# Patient Record
Sex: Male | Born: 1940 | Race: Black or African American | Hispanic: No | Marital: Single | Smoking: Former smoker
Health system: Southern US, Community
[De-identification: ages and names within clinical notes are randomized; demographics above are authoritative.]

## PROBLEM LIST (undated history)

## (undated) DIAGNOSIS — E785 Hyperlipidemia, unspecified: Secondary | ICD-10-CM

## (undated) DIAGNOSIS — E119 Type 2 diabetes mellitus without complications: Secondary | ICD-10-CM

---

## 2016-05-11 ENCOUNTER — Emergency Department (HOSPITAL_COMMUNITY): Payer: PRIVATE HEALTH INSURANCE

## 2016-05-11 ENCOUNTER — Encounter (HOSPITAL_COMMUNITY): Payer: Self-pay | Admitting: Emergency Medicine

## 2016-05-11 ENCOUNTER — Observation Stay (HOSPITAL_COMMUNITY)
Admission: EM | Admit: 2016-05-11 | Discharge: 2016-05-14 | Disposition: A | Discharge status: Home / Self Care | Payer: PRIVATE HEALTH INSURANCE | Attending: Cardiovascular Disease | Admitting: Cardiovascular Disease

## 2016-05-11 DIAGNOSIS — Z87891 Personal history of nicotine dependence: Secondary | ICD-10-CM | POA: Insufficient documentation

## 2016-05-11 DIAGNOSIS — R9439 Abnormal result of other cardiovascular function study: Secondary | ICD-10-CM | POA: Diagnosis not present

## 2016-05-11 DIAGNOSIS — R9430 Abnormal result of cardiovascular function study, unspecified: Secondary | ICD-10-CM | POA: Diagnosis present

## 2016-05-11 DIAGNOSIS — N179 Acute kidney failure, unspecified: Secondary | ICD-10-CM | POA: Insufficient documentation

## 2016-05-11 DIAGNOSIS — I472 Ventricular tachycardia: Secondary | ICD-10-CM | POA: Diagnosis not present

## 2016-05-11 DIAGNOSIS — N183 Chronic kidney disease, stage 3 unspecified: Secondary | ICD-10-CM | POA: Diagnosis present

## 2016-05-11 DIAGNOSIS — I2510 Atherosclerotic heart disease of native coronary artery without angina pectoris: Secondary | ICD-10-CM | POA: Diagnosis not present

## 2016-05-11 DIAGNOSIS — I25810 Atherosclerosis of coronary artery bypass graft(s) without angina pectoris: Secondary | ICD-10-CM

## 2016-05-11 DIAGNOSIS — Z7984 Long term (current) use of oral hypoglycemic drugs: Secondary | ICD-10-CM | POA: Insufficient documentation

## 2016-05-11 DIAGNOSIS — E1122 Type 2 diabetes mellitus with diabetic chronic kidney disease: Secondary | ICD-10-CM | POA: Diagnosis not present

## 2016-05-11 DIAGNOSIS — Z8249 Family history of ischemic heart disease and other diseases of the circulatory system: Secondary | ICD-10-CM | POA: Insufficient documentation

## 2016-05-11 DIAGNOSIS — E785 Hyperlipidemia, unspecified: Secondary | ICD-10-CM | POA: Diagnosis not present

## 2016-05-11 DIAGNOSIS — R55 Syncope and collapse: Principal | ICD-10-CM | POA: Diagnosis present

## 2016-05-11 DIAGNOSIS — Z79899 Other long term (current) drug therapy: Secondary | ICD-10-CM | POA: Insufficient documentation

## 2016-05-11 DIAGNOSIS — Z7982 Long term (current) use of aspirin: Secondary | ICD-10-CM | POA: Diagnosis not present

## 2016-05-11 DIAGNOSIS — E1129 Type 2 diabetes mellitus with other diabetic kidney complication: Secondary | ICD-10-CM | POA: Diagnosis present

## 2016-05-11 DIAGNOSIS — I10 Essential (primary) hypertension: Secondary | ICD-10-CM | POA: Diagnosis present

## 2016-05-11 DIAGNOSIS — I129 Hypertensive chronic kidney disease with stage 1 through stage 4 chronic kidney disease, or unspecified chronic kidney disease: Secondary | ICD-10-CM | POA: Diagnosis not present

## 2016-05-11 HISTORY — DX: Hyperlipidemia, unspecified: E78.5

## 2016-05-11 HISTORY — DX: Type 2 diabetes mellitus without complications: E11.9

## 2016-05-11 LAB — CBC WITH DIFFERENTIAL/PLATELET
BASOS ABS: 0 10*3/uL (ref 0.0–0.1)
BASOS PCT: 0 %
Eosinophils Absolute: 0.1 10*3/uL (ref 0.0–0.7)
Eosinophils Relative: 2 %
HEMATOCRIT: 37.4 % — AB (ref 39.0–52.0)
HEMOGLOBIN: 12.6 g/dL — AB (ref 13.0–17.0)
LYMPHS PCT: 29 %
Lymphs Abs: 1.6 10*3/uL (ref 0.7–4.0)
MCH: 29.7 pg (ref 26.0–34.0)
MCHC: 33.7 g/dL (ref 30.0–36.0)
MCV: 88.2 fL (ref 78.0–100.0)
Monocytes Absolute: 0.4 10*3/uL (ref 0.1–1.0)
Monocytes Relative: 7 %
NEUTROS ABS: 3.5 10*3/uL (ref 1.7–7.7)
NEUTROS PCT: 62 %
Platelets: 223 10*3/uL (ref 150–400)
RBC: 4.24 MIL/uL (ref 4.22–5.81)
RDW: 13 % (ref 11.5–15.5)
WBC: 5.6 10*3/uL (ref 4.0–10.5)

## 2016-05-11 LAB — BASIC METABOLIC PANEL
ANION GAP: 5 (ref 5–15)
BUN: 22 mg/dL — ABNORMAL HIGH (ref 6–20)
CALCIUM: 9.3 mg/dL (ref 8.9–10.3)
CHLORIDE: 106 mmol/L (ref 101–111)
CO2: 29 mmol/L (ref 22–32)
Creatinine, Ser: 1.56 mg/dL — ABNORMAL HIGH (ref 0.61–1.24)
GFR calc non Af Amer: 41 mL/min — ABNORMAL LOW (ref >60)
GFR, EST AFRICAN AMERICAN: 48 mL/min — AB (ref >60)
GLUCOSE: 101 mg/dL — AB (ref 65–99)
POTASSIUM: 3.5 mmol/L (ref 3.5–5.1)
Sodium: 140 mmol/L (ref 135–145)

## 2016-05-11 LAB — I-STAT TROPONIN, ED: TROPONIN I, POC: 0.01 ng/mL (ref 0.00–0.08)

## 2016-05-11 LAB — GLUCOSE, CAPILLARY: GLUCOSE-CAPILLARY: 114 mg/dL — AB (ref 65–99)

## 2016-05-11 LAB — CBG MONITORING, ED: Glucose-Capillary: 114 mg/dL — ABNORMAL HIGH (ref 65–99)

## 2016-05-11 LAB — TROPONIN I: Troponin I: <0.03 ng/mL (ref <0.03)

## 2016-05-11 IMAGING — CR DG CHEST 1V PORT
1 series · 1 of 1 positions shown · non-contrast
Comparison: None.

CLINICAL DATA: Palpitations, shortness of breath, near syncope

EXAM:
PORTABLE CHEST 1 VIEW

[AP]
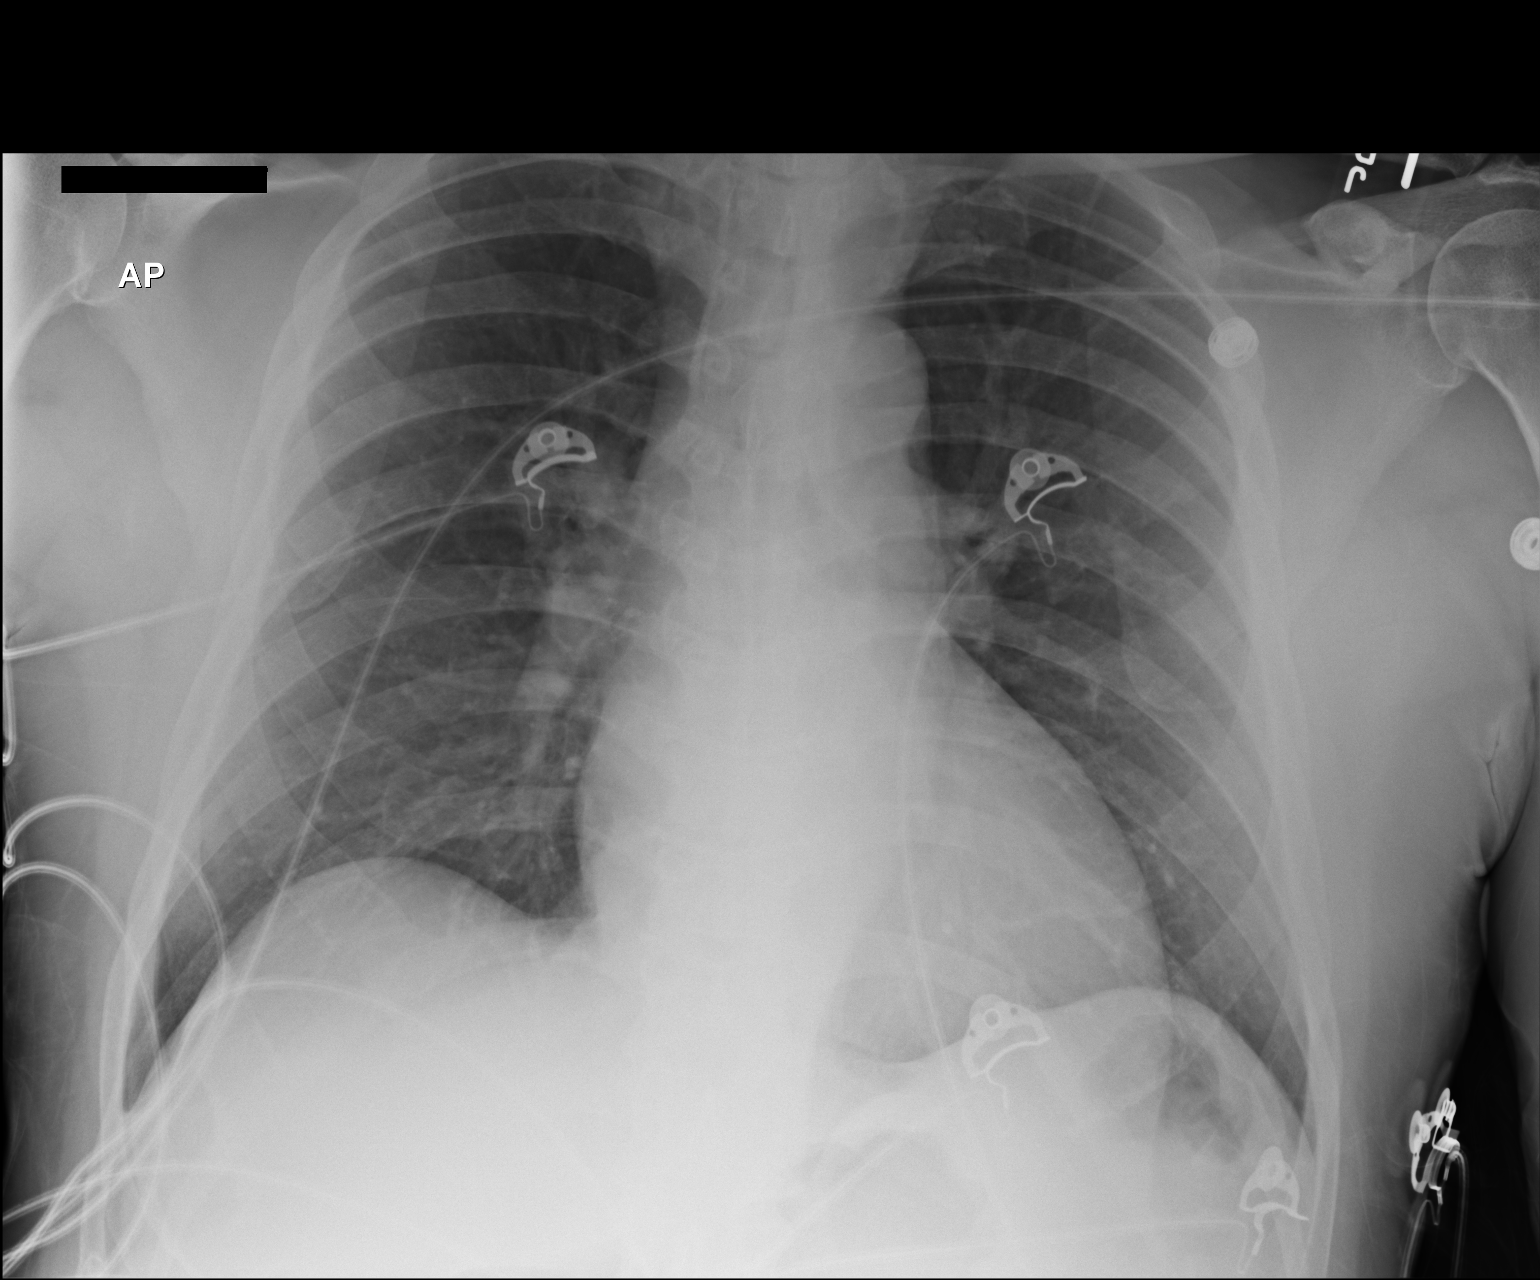

[1 of 1 positions shown; findings below may reference images not displayed]

FINDINGS: Lungs are clear.  No pleural effusion or pneumothorax.

The heart is normal in size.
IMPRESSION: No evidence of acute cardiopulmonary disease.

## 2016-05-11 MED ORDER — ROSUVASTATIN CALCIUM 10 MG PO TABS
10.0000 mg | ORAL_TABLET | Freq: Every evening | ORAL | Status: DC
  Administered 2016-05-11 – 2016-05-13 (×3): 10 mg via ORAL
  Filled 2016-05-11 (×3): qty 1

## 2016-05-11 MED ORDER — ASPIRIN EC 81 MG PO TBEC
81.0000 mg | DELAYED_RELEASE_TABLET | Freq: Every day | ORAL | Status: DC
  Administered 2016-05-12 – 2016-05-13 (×2): 81 mg via ORAL
  Filled 2016-05-11 (×2): qty 1

## 2016-05-11 MED ORDER — ACETAMINOPHEN 325 MG PO TABS: 650.0000 mg | ORAL_TABLET | ORAL | Status: DC | PRN

## 2016-05-11 MED ORDER — HEPARIN SODIUM (PORCINE) 5000 UNIT/ML IJ SOLN
5000.0000 [IU] | Freq: Three times a day (TID) | INTRAMUSCULAR | Status: DC
  Administered 2016-05-11 – 2016-05-14 (×7): 5000 [IU] via SUBCUTANEOUS
  Filled 2016-05-11 (×7): qty 1

## 2016-05-11 MED ORDER — INSULIN ASPART 100 UNIT/ML ~~LOC~~ SOLN: 0.0000 [IU] | Freq: Three times a day (TID) | SUBCUTANEOUS | Status: DC

## 2016-05-11 MED ORDER — NITROGLYCERIN 0.4 MG SL SUBL: 0.4000 mg | SUBLINGUAL_TABLET | SUBLINGUAL | Status: DC | PRN

## 2016-05-11 MED ORDER — ASPIRIN 81 MG PO CHEW
324.0000 mg | CHEWABLE_TABLET | ORAL | Status: AC
  Administered 2016-05-11: 324 mg via ORAL
  Filled 2016-05-11: qty 4

## 2016-05-11 MED ORDER — ASPIRIN 300 MG RE SUPP: 300.0000 mg | RECTAL | Status: AC

## 2016-05-11 MED ORDER — IRBESARTAN 75 MG PO TABS
37.5000 mg | ORAL_TABLET | Freq: Every day | ORAL | Status: DC
  Administered 2016-05-12: 37.5 mg via ORAL
  Filled 2016-05-11: qty 0.5

## 2016-05-11 MED ORDER — ONDANSETRON HCL 4 MG/2ML IJ SOLN: 4.0000 mg | Freq: Four times a day (QID) | INTRAMUSCULAR | Status: DC | PRN

## 2016-05-11 NOTE — H&P (Signed)
History & Physical    Patient ID: Delphin Funes MRN: [id number], DOB/AGE: Apr 24, 1941   Admit date: 05/11/2016   Primary Physician: No primary care provider on file. Primary Cardiologist: New  Patient Profile    75 yo male with PMH of HTN, HLD Vertigo and DM who presented to the [facility] ED with c/o near syncope and palpitations.  Past Medical History    Past Medical History:  Diagnosis Date  . Diabetes mellitus without complication (HCC)   . Hyperlipemia     History reviewed. No pertinent surgical history.   Allergies  No Known Allergies  History of Present Illness    Mr. Gilman is a 75 year old male with past medical history of hypertension, hyperlipidemia, vertigo and diabetes mellitus type 2. Reports he currently lives in [place], but has been here visiting his daughter for the past couple of weeks. He denies ever having had a cardiac workup in the past, or seeing a cardiologist. Does report family history of cardiac disease with mother having an MI in her 96s. States his father past when he was young. He is very active 75 year old who currently cares for his wife back at home. States he does not normally have any anginal symptoms with physical activity, or dyspnea on exertion. Reports he has had intermittent episodes over the past couple of weeks where he is felt lightheaded for a brief period, but normally resolves itself. Reports this morning at 9 AM he began to have palpitations, felt like his heart was racing, and his head felt "funny". Reports he has been drinking Herbalife shakes and teas over the past couple of weeks. He is unsure whether there is caffeine and these products. Also does report being under increased amount of stress with having his granddaughter visited him in [place], and caring for his wife.  He presented to the [facility] with his symptoms and experienced another near syncopal episode while there. At that time EMS was called for transfer to [facility] ED. In  the ED his EKG shows sinus rhythm with PACs, with nonspecific T-wave changes. There are no other EKGs on file to compare too. Labs showed normal electrolytes, but increased creatinine of 1.56, hemoglobin 12.6, and 1 negative POC troponin. Chest x-ray showed no edema or acute disease. While in the ED he has had 2 brief runs of NSVT, couplets, and frequent PVCs noted on telemetry. He is currently asymptomatic at time of exam.  Home Medications    Prior to Admission medications   Not on File    Family History    History reviewed. No pertinent family history.  Social History    Social History   Social History  . Marital status: Single    Spouse name: N/A  . Number of children: N/A  . Years of education: N/A   Occupational History  . Not on file.   Social History Main Topics  . Smoking status: Former Games developer  . Smokeless tobacco: Not on file  . Alcohol use No  . Drug use: No  . Sexual activity: Not on file   Other Topics Concern  . Not on file   Social History Narrative  . No narrative on file     Review of Systems    General:  No chills, fever, night sweats or weight changes.  Cardiovascular: See HPI Dermatological: No rash, lesions/masses Respiratory: No cough, dyspnea Urologic: No hematuria, dysuria Abdominal:   No nausea, vomiting, diarrhea, bright red blood per rectum, melena, or hematemesis Neurologic:  No visual changes, wkns, changes in mental status. All other systems reviewed and are otherwise negative except as noted above.  Physical Exam    Blood pressure 135/79, pulse (!) 59, temperature 97.9 F (36.6 C), temperature source Oral, resp. rate 17, SpO2 94 %.  General: Pleasant Older African-American male, NAD Psych: Normal affect. Neuro: Alert and oriented X 3. Moves all extremities spontaneously. HEENT: Normal  Neck: Supple without bruits or JVD. Lungs:  Resp regular and unlabored, CTA. Heart: RRR no s3, s4, or murmurs. Abdomen: Soft, non-tender,  non-distended, BS + x 4.  Extremities: No clubbing, cyanosis, chronic 1+ lower extremity edema. DP/PT/Radials 2+ and equal bilaterally.  Labs    Troponin [facility](Point of Care Test)  Recent Labs  05/11/16 1349  TROPIPOC 0.01   No results for input(s): CKTOTAL, CKMB, TROPONINI in the last 72 hours. Lab Results  Component Value Date   WBC 5.6 05/11/2016   HGB 12.6 (L) 05/11/2016   HCT 37.4 (L) 05/11/2016   MCV 88.2 05/11/2016   PLT 223 05/11/2016     Recent Labs Lab 05/11/16 1346  NA 140  K 3.5  CL 106  CO2 29  BUN 22*  CREATININE 1.56*  CALCIUM 9.3  GLUCOSE 101*   No results found for: CHOL, HDL, LDLCALC, TRIG No results found for: [facility]   Radiology Studies    Dg Chest Port 1 View  Result Date: 05/11/2016 CLINICAL DATA:  Palpitations, shortness of breath, near syncope EXAM: PORTABLE CHEST 1 VIEW COMPARISON:  None. FINDINGS: Lungs are clear.  No pleural effusion or pneumothorax. The heart is normal in size. IMPRESSION: No evidence of acute cardiopulmonary disease. Electronically Signed   By: Charline BillsSriyesh  Krishnan M.D.   On: 05/11/2016 13:23    ECG & Cardiac Imaging    EKG: sinus rhythm with PACs, with nonspecific T-wave changes.  Assessment & Plan    75 yo male with PMH of HTN, HLD Vertigo and DM who [facility] with c/o near syncope and palpitations.  1. Presyncope: Reports he has recently been drinking herbalife shakes. States this morning he drank 2 packets of herbalife teas prior to experiencing symptoms of presyncope and palpitations. Labs in the ED showed neg POC trop, Cr 1.56, stable Hgb. EKG shows SR with PACs with nonspecific T wave changes, with telemetry noting 2 short runs of NSVT, and freq PVCs. Chest x-ray was negative. He is currently asymptomatic. Denies any chest pain, but does have concerning risk factors including HTN, HLD and DM II.  -- Admit to telemetry to observe for further arrhthymias.  -- Check 2D echo -- Cycle enzymes  -- Make  NPO tomorrow and plan for exercise myoview   2. HTN: Continue ARB  3. HLD: Check Lipid panel. Continue statin  4. DM II: Hold januvia, start SSI  5. AKI: Cr slightly elevated. Will hold Lasix as he does not appear to be volume overloaded at this time. Follow BMET   Signed, Laverda PageLindsay Roberts, NP-C Pager [phone] 05/11/2016, 4:27 PM  Patient examined chart reviewed.  Concerning that sense he was going to pass out correlated With palpitations and he is noted to have PAC;s and NSVT on monitor. QT ok no chest pain Or history of CAD. No murmur on exam Chronic LE edema he takes diuretic for which explains Mild azotemia. Supplement K  Admit for telemetry. R/O structural heart dx with echo and  Exercise induced arrhythmia / CAD with myovue in am. Possible d/c late Saturday if tests  Ok and monitor with no malignant arrhythmias  Charlton Haws

## 2016-05-11 NOTE — ED Notes (Signed)
This RN notified at this time of 4 beat run of v-tach on monitor  Dr. Ranae PalmsYelverton aware . Patient on defib pads and cardiac monitor. Resolved pt feels fine resting in bed  NNO.

## 2016-05-11 NOTE — ED Notes (Signed)
Attempted report, bed recently assigned, RN unaware

## 2016-05-11 NOTE — ED Provider Notes (Signed)
[facility]-EMERGENCY DEPT Provider Note   CSN: [id number] Arrival date & time: 05/11/16  1257     History   Chief Complaint Chief Complaint  Patient presents with  . Near Syncope    HPI Daniel Ramsey is a 75 y.o. male.  Patient presents to the emergency department with chief complaint of near syncope. He states that he has had several episodes today of feeling faint like he is going to pass out. He reports the first started at about 9:00 this morning. He went to a clinic, and was referred to the emergency department for further evaluation. He denies any chest pain, shortness of breath, nausea, diaphoresis, or any other symptoms. He states that he simply feels like he is going to pass out. He has not actually passed out. He denies ever having similar symptoms to this in the past. There are no other associated symptoms. There are no modifying factors.   The history is provided by the patient. No language interpreter was used.    Past Medical History:  Diagnosis Date  . Diabetes mellitus without complication (HCC)   . Hyperlipemia     There are no active problems to display for this patient.   History reviewed. No pertinent surgical history.     Home Medications    Prior to Admission medications   Not on File    Family History History reviewed. No pertinent family history.  Social History Social History  Substance Use Topics  . Smoking status: Former Games developermoker  . Smokeless tobacco: Not on file  . Alcohol use No     Allergies   Review of patient's allergies indicates no known allergies.   Review of Systems Review of Systems  Neurological: Positive for syncope.  All other systems reviewed and are negative.    Physical Exam Updated Vital Signs BP 149/74 (BP Location: Right Arm)   Pulse 76   Temp 97.9 F (36.6 C) (Oral)   Resp 15   SpO2 96%   Physical Exam  Constitutional: He is oriented to person, place, and time. He appears well-developed and  well-nourished.  HENT:  Head: Normocephalic and atraumatic.  Eyes: Conjunctivae and EOM are normal. Pupils are equal, round, and reactive to light. Right eye exhibits no discharge. Left eye exhibits no discharge. No scleral icterus.  Neck: Normal range of motion. Neck supple. No JVD present.  Cardiovascular: Normal rate, regular rhythm and normal heart sounds.  Exam reveals no gallop and no friction rub.   No murmur heard. Pulmonary/Chest: Effort normal and breath sounds normal. No respiratory distress. He has no wheezes. He has no rales. He exhibits no tenderness.  Abdominal: Soft. He exhibits no distension and no mass. There is no tenderness. There is no rebound and no guarding.  Musculoskeletal: Normal range of motion. He exhibits no edema or tenderness.  Neurological: He is alert and oriented to person, place, and time.  Skin: Skin is warm and dry.  Psychiatric: He has a normal mood and affect. His behavior is normal. Judgment and thought content normal.  Nursing note and vitals reviewed.    ED Treatments / Results  Labs (all labs ordered are listed, but only abnormal results are displayed) Labs Reviewed  CBG MONITORING, ED - Abnormal; Notable for the following:       Result Value   Glucose-Capillary 114 (*)    All other components within normal limits  CBC WITH DIFFERENTIAL/PLATELET  BASIC METABOLIC PANEL  I-STAT TROPOININ, ED    EKG  EKG Interpretation  None       Radiology Dg Chest Port 1 View  Result Date: 05/11/2016 CLINICAL DATA:  Palpitations, shortness of breath, near syncope EXAM: PORTABLE CHEST 1 VIEW COMPARISON:  None. FINDINGS: Lungs are clear.  No pleural effusion or pneumothorax. The heart is normal in size. IMPRESSION: No evidence of acute cardiopulmonary disease. Electronically Signed   By: Charline Bills M.D.   On: 05/11/2016 13:23    Procedures Procedures (including critical care time)  Medications Ordered in ED Medications - No data to  display   Initial Impression / Assessment and Plan / ED Course  I have reviewed the triage vital signs and the nursing notes.  Pertinent labs & imaging results that were available during my care of the patient were reviewed by me and considered in my medical decision making (see chart for details).  Clinical Course   1:30 PM Patient with near syncope.  During exam I witnessed a short run of v-tach on the monitor.  Patient seen by and discussed with Dr. Ranae Palms, recommends cardiology consult.  1:54 PM Discussed with Dr. Tenny Craw from cardiology, who will consult. No amiodarone for now.  VSS.  Dr. Ranae Palms updated.  Pads placed on patient.  Patient discussed with Dr. Vinnie Level and Ardelle Park, who are aware of the patient.  Dispo per cards.  Final Clinical Impressions(s) / ED Diagnoses   Final diagnoses:  None    New Prescriptions New Prescriptions   No medications on file     Roxy Horseman, PA-C 05/11/16 1535    Loren Racer, MD 05/16/16 [phone]

## 2016-05-11 NOTE — ED Triage Notes (Signed)
Per EMS patient drank a new herbalife shake, felt heart palpitating, like he was going to faint. Went to [facility] while there had another near syncopal event. EKG showed occasional PAC and PVCs.  Urgent care called EMS. Their vitals HR 50 bp 116/71 . Patient denies recent n/v/diarrhea. States symptoms have improved at present. CBG 135.18g LFA. Patient from [place], just moved here Aug 23rd. EMS vitals 150/88 HR 70 RR 18, 98% RA

## 2016-05-12 ENCOUNTER — Observation Stay (HOSPITAL_COMMUNITY): Payer: PRIVATE HEALTH INSURANCE

## 2016-05-12 ENCOUNTER — Observation Stay (HOSPITAL_BASED_OUTPATIENT_CLINIC_OR_DEPARTMENT_OTHER): Payer: PRIVATE HEALTH INSURANCE

## 2016-05-12 ENCOUNTER — Encounter (HOSPITAL_COMMUNITY): Payer: Self-pay | Admitting: Nurse Practitioner

## 2016-05-12 DIAGNOSIS — R9431 Abnormal electrocardiogram [ECG] [EKG]: Secondary | ICD-10-CM

## 2016-05-12 DIAGNOSIS — I10 Essential (primary) hypertension: Secondary | ICD-10-CM | POA: Diagnosis present

## 2016-05-12 DIAGNOSIS — R55 Syncope and collapse: Secondary | ICD-10-CM

## 2016-05-12 DIAGNOSIS — I472 Ventricular tachycardia: Secondary | ICD-10-CM

## 2016-05-12 DIAGNOSIS — E1129 Type 2 diabetes mellitus with other diabetic kidney complication: Secondary | ICD-10-CM | POA: Diagnosis present

## 2016-05-12 DIAGNOSIS — N183 Chronic kidney disease, stage 3 unspecified: Secondary | ICD-10-CM | POA: Diagnosis present

## 2016-05-12 DIAGNOSIS — E785 Hyperlipidemia, unspecified: Secondary | ICD-10-CM | POA: Diagnosis present

## 2016-05-12 DIAGNOSIS — R9430 Abnormal result of cardiovascular function study, unspecified: Secondary | ICD-10-CM | POA: Diagnosis present

## 2016-05-12 LAB — BASIC METABOLIC PANEL
Anion gap: 5 (ref 5–15)
BUN: 20 mg/dL (ref 6–20)
CHLORIDE: 108 mmol/L (ref 101–111)
CO2: 29 mmol/L (ref 22–32)
CREATININE: 1.53 mg/dL — AB (ref 0.61–1.24)
Calcium: 9 mg/dL (ref 8.9–10.3)
GFR calc non Af Amer: 42 mL/min — ABNORMAL LOW (ref >60)
GFR, EST AFRICAN AMERICAN: 49 mL/min — AB (ref >60)
Glucose, Bld: 84 mg/dL (ref 65–99)
POTASSIUM: 3.9 mmol/L (ref 3.5–5.1)
SODIUM: 142 mmol/L (ref 135–145)

## 2016-05-12 LAB — GLUCOSE, CAPILLARY
GLUCOSE-CAPILLARY: 85 mg/dL (ref 65–99)
GLUCOSE-CAPILLARY: 159 mg/dL — AB (ref 65–99)
GLUCOSE-CAPILLARY: 118 mg/dL — AB (ref 65–99)
GLUCOSE-CAPILLARY: 104 mg/dL — AB (ref 65–99)
Glucose-Capillary: 76 mg/dL (ref 65–99)

## 2016-05-12 LAB — LIPID PANEL
CHOL/HDL RATIO: 3.6 ratio
CHOLESTEROL: 142 mg/dL (ref 0–200)
HDL: 39 mg/dL — AB (ref >40)
LDL Cholesterol: 88 mg/dL (ref 0–99)
TRIGLYCERIDES: 74 mg/dL (ref <150)
VLDL: 15 mg/dL (ref 0–40)

## 2016-05-12 LAB — NM MYOCAR MULTI W/SPECT W/WALL MOTION / EF
CHL CUP RESTING HR STRESS: 68 {beats}/min
CSEPEDS: 33 s
CSEPPHR: 157 {beats}/min
Exercise duration (min): 2 min

## 2016-05-12 LAB — CBC
HEMATOCRIT: 36.1 % — AB (ref 39.0–52.0)
Hemoglobin: 11.5 g/dL — ABNORMAL LOW (ref 13.0–17.0)
MCH: 28.6 pg (ref 26.0–34.0)
MCHC: 31.9 g/dL (ref 30.0–36.0)
MCV: 89.8 fL (ref 78.0–100.0)
PLATELETS: 209 10*3/uL (ref 150–400)
RBC: 4.02 MIL/uL — AB (ref 4.22–5.81)
RDW: 12.9 % (ref 11.5–15.5)
WBC: 4.5 10*3/uL (ref 4.0–10.5)

## 2016-05-12 LAB — TROPONIN I
Troponin I: <0.03 ng/mL (ref <0.03)
Troponin I: <0.03 ng/mL (ref <0.03)

## 2016-05-12 MED ORDER — TECHNETIUM TC 99M TETROFOSMIN IV KIT
30.0000 | PACK | Freq: Once | INTRAVENOUS | Status: AC | PRN
  Administered 2016-05-12: 30 via INTRAVENOUS

## 2016-05-12 MED ORDER — REGADENOSON 0.4 MG/5ML IV SOLN
INTRAVENOUS | Status: AC
  Filled 2016-05-12: qty 5

## 2016-05-12 MED ORDER — TECHNETIUM TC 99M TETROFOSMIN IV KIT
10.0000 | PACK | Freq: Once | INTRAVENOUS | Status: AC | PRN
  Administered 2016-05-12: 10 via INTRAVENOUS

## 2016-05-12 NOTE — Progress Notes (Signed)
Gxt completed. Images and final report pending  Corine ShelterLUKE Tracy Kinner PA-C 05/12/2016 11:06 AM

## 2016-05-12 NOTE — Progress Notes (Signed)
  Echocardiogram 2D Echocardiogram has been performed.  Arvil ChacoFoster, Alicia Ackert 05/12/2016, 6:27 PM

## 2016-05-12 NOTE — Progress Notes (Deleted)
Discussed Myoview results with pt and family. He wants to think about cath. Will discuss further in am.  Corine ShelterLUKE Adonus Uselman PA-C 05/12/2016 4:50 PM

## 2016-05-12 NOTE — Progress Notes (Signed)
Patient ID: Daniel Ramsey, male   DOB: [birth year], 75 y.o.   MRN: [id number]    Patient Name: Daniel Maskerlfred Ramsey Date of Encounter: 05/12/2016     Active Problems:   Pre-syncope    SUBJECTIVE  No syncope or palpitations. No chest pain.  CURRENT MEDS . aspirin EC  81 mg Oral Daily  . heparin  5,000 Units Subcutaneous Q8H  . insulin aspart  0-15 Units Subcutaneous TID WC  . irbesartan  37.5 mg Oral Daily  . rosuvastatin  10 mg Oral QPM    OBJECTIVE  Vitals:   05/12/16 1045 05/12/16 1103 05/12/16 1107 05/12/16 1108  BP: (!) 156/99 (!) 143/74 (!) 198/73 (!) 151/73  Pulse:      Resp:      Temp:      TempSrc:      SpO2:      Weight:      Height:        Intake/Output Summary (Last 24 hours) at 05/12/16 1341 Last data filed at 05/11/16 1649  Gross per 24 hour  Intake                0 ml  Output              150 ml  Net             -150 ml   Filed Weights   05/12/16 0600  Weight: 179 lb 3.7 oz (81.3 kg)    PHYSICAL EXAM  General: Pleasant, NAD. Neuro: Alert and oriented X 3. Moves all extremities spontaneously. Psych: Normal affect. HEENT:  Normal  Neck: Supple without bruits or JVD. Lungs:  Resp regular and unlabored, CTA. Heart: RRR no s3, s4, or murmurs. Abdomen: Soft, non-tender, non-distended, BS + x 4.  Extremities: No clubbing, cyanosis or edema. DP/PT/Radials 2+ and equal bilaterally.  Accessory Clinical Findings  CBC  Recent Labs  05/11/16 1346 05/12/16 0718  WBC 5.6 4.5  NEUTROABS 3.5  --   HGB 12.6* 11.5*  HCT 37.4* 36.1*  MCV 88.2 89.8  PLT 223 209   Basic Metabolic Panel  Recent Labs  05/11/16 1346 05/12/16 0718  NA 140 142  K 3.5 3.9  CL 106 108  CO2 29 29  GLUCOSE 101* 84  BUN 22* 20  CREATININE 1.56* 1.53*  CALCIUM 9.3 9.0   Liver Function Tests No results for input(s): AST, ALT, ALKPHOS, BILITOT, PROT, ALBUMIN in the last 72 hours. No results for input(s): LIPASE, AMYLASE in the last 72 hours. Cardiac Enzymes  Recent  Labs  05/11/16 1840 05/11/16 2329 05/12/16 0718  TROPONINI <0.03 <0.03 <0.03   BNP Invalid input(s): POCBNP D-Dimer No results for input(s): DDIMER in the last 72 hours. Hemoglobin A1C No results for input(s): HGBA1C in the last 72 hours. Fasting Lipid Panel  Recent Labs  05/12/16 0718  CHOL 142  HDL 39*  LDLCALC 88  TRIG 74  CHOLHDL 3.6   Thyroid Function Tests No results for input(s): TSH, T4TOTAL, T3FREE, THYROIDAB in the last 72 hours.  Invalid input(s): FREET3  TELE NSR with PVC's  Radiology/Studies  Dg Chest Port 1 View  Result Date: 05/11/2016 CLINICAL DATA:  Palpitations, shortness of breath, near syncope EXAM: PORTABLE CHEST 1 VIEW COMPARISON:  None. FINDINGS: Lungs are clear.  No pleural effusion or pneumothorax. The heart is normal in size. IMPRESSION: No evidence of acute cardiopulmonary disease. Electronically Signed   By: Charline BillsSriyesh  Krishnan M.D.   On: 05/11/2016 13:23    ASSESSMENT  AND PLAN  1. NSVT - no additional symptoms. He has undergone exercise treadmill. Results are pending. He can go home once back if no big scar or ischemia. 2. HTN - his blood pressure is elevated. Go home on coreg 3.125 bid (new script) 3. Dizzy - the etiology is unclear. No additional treatment now. If it continues, would consider a heart monitor.  Gregg Taylor,M.D.  05/12/2016 1:41 PM

## 2016-05-12 NOTE — Progress Notes (Signed)
Myoview abnormal. Pt wants to think about cath. I will place on cath board. I also stopped ARB with renal insufficiency pre cath.   Corine ShelterLUKE Emera Bussie PA-C 05/12/2016 4:53 PM

## 2016-05-13 ENCOUNTER — Encounter (HOSPITAL_COMMUNITY): Payer: Self-pay | Admitting: *Deleted

## 2016-05-13 DIAGNOSIS — R55 Syncope and collapse: Secondary | ICD-10-CM | POA: Diagnosis not present

## 2016-05-13 LAB — ECHOCARDIOGRAM COMPLETE
Height: 66 in
Weight: 2867.74 oz

## 2016-05-13 LAB — BASIC METABOLIC PANEL
Anion gap: 4 — ABNORMAL LOW (ref 5–15)
BUN: 18 mg/dL (ref 6–20)
CALCIUM: 8.9 mg/dL (ref 8.9–10.3)
CO2: 26 mmol/L (ref 22–32)
CREATININE: 1.58 mg/dL — AB (ref 0.61–1.24)
Chloride: 111 mmol/L (ref 101–111)
GFR calc non Af Amer: 41 mL/min — ABNORMAL LOW (ref >60)
GFR, EST AFRICAN AMERICAN: 47 mL/min — AB (ref >60)
Glucose, Bld: 93 mg/dL (ref 65–99)
Potassium: 4 mmol/L (ref 3.5–5.1)
SODIUM: 141 mmol/L (ref 135–145)

## 2016-05-13 LAB — PROTIME-INR
INR: 1.02
Prothrombin Time: 13.4 seconds (ref 11.4–15.2)

## 2016-05-13 LAB — GLUCOSE, CAPILLARY
GLUCOSE-CAPILLARY: 83 mg/dL (ref 65–99)
GLUCOSE-CAPILLARY: 83 mg/dL (ref 65–99)
Glucose-Capillary: 85 mg/dL (ref 65–99)

## 2016-05-13 MED ORDER — SODIUM CHLORIDE 0.9% FLUSH: 3.0000 mL | INTRAVENOUS | Status: DC | PRN

## 2016-05-13 MED ORDER — SODIUM CHLORIDE 0.9 % WEIGHT BASED INFUSION
1.0000 mL/kg/h | INTRAVENOUS | Status: DC
  Administered 2016-05-13: 1 mL/kg/h via INTRAVENOUS

## 2016-05-13 MED ORDER — SODIUM CHLORIDE 0.9 % IV SOLN: 250.0000 mL | INTRAVENOUS | Status: DC | PRN

## 2016-05-13 MED ORDER — ASPIRIN 81 MG PO CHEW
81.0000 mg | CHEWABLE_TABLET | ORAL | Status: AC
  Administered 2016-05-14: 81 mg via ORAL
  Filled 2016-05-13: qty 1

## 2016-05-13 MED ORDER — SODIUM CHLORIDE 0.9% FLUSH
3.0000 mL | Freq: Two times a day (BID) | INTRAVENOUS | Status: DC
  Administered 2016-05-13: 3 mL via INTRAVENOUS

## 2016-05-13 NOTE — Progress Notes (Signed)
Accu ck. 110

## 2016-05-13 NOTE — Progress Notes (Signed)
Patient ID: Daniel Ramsey, male   DOB: [birth year], 75 y.o.   MRN: [id number]    Patient Name: Daniel Ramsey Date of Encounter: 05/13/2016     Principal Problem:   Pre-syncope Active Problems:   Essential hypertension   Controlled type 2 diabetes with renal manifestation (HCC)   Dyslipidemia   Abnormal result of cardiovascular function study   Chronic renal disease, stage III    SUBJECTIVE  No chest pain or sob. Note results of stess test.  CURRENT MEDS . aspirin EC  81 mg Oral Daily  . heparin  5,000 Units Subcutaneous Q8H  . insulin aspart  0-15 Units Subcutaneous TID WC  . rosuvastatin  10 mg Oral QPM    OBJECTIVE  Vitals:   05/12/16 1108 05/12/16 1434 05/12/16 2055 05/13/16 0516  BP: (!) 151/73 128/78 119/72 121/66  Pulse:  83 (!) 58 61  Resp:  18 18 18   Temp:  98.6 F (37 C) 98 F (36.7 C) 98.2 F (36.8 C)  TempSrc:  Oral Oral Oral  SpO2:  100% 99% 100%  Weight:      Height:        Intake/Output Summary (Last 24 hours) at 05/13/16 1046 Last data filed at 05/13/16 0600  Gross per 24 hour  Intake              600 ml  Output                0 ml  Net              600 ml   Filed Weights   05/12/16 0600  Weight: 179 lb 3.7 oz (81.3 kg)    PHYSICAL EXAM  General: Pleasant, NAD. Neuro: Alert and oriented X 3. Moves all extremities spontaneously. Psych: Normal affect. HEENT:  Normal  Neck: Supple without bruits or JVD. Lungs:  Resp regular and unlabored, CTA. Heart: RRR no s3, s4, or murmurs. Abdomen: Soft, non-tender, non-distended, BS + x 4.  Extremities: No clubbing, cyanosis or edema. DP/PT/Radials 2+ and equal bilaterally.  Accessory Clinical Findings  CBC  Recent Labs  05/11/16 1346 05/12/16 0718  WBC 5.6 4.5  NEUTROABS 3.5  --   HGB 12.6* 11.5*  HCT 37.4* 36.1*  MCV 88.2 89.8  PLT 223 209   Basic Metabolic Panel  Recent Labs  05/12/16 0718 05/13/16 0519  NA 142 141  K 3.9 4.0  CL 108 111  CO2 29 26  GLUCOSE 84 93  BUN 20 18   CREATININE 1.53* 1.58*  CALCIUM 9.0 8.9   Liver Function Tests No results for input(s): AST, ALT, ALKPHOS, BILITOT, PROT, ALBUMIN in the last 72 hours. No results for input(s): LIPASE, AMYLASE in the last 72 hours. Cardiac Enzymes  Recent Labs  05/11/16 1840 05/11/16 2329 05/12/16 0718  TROPONINI <0.03 <0.03 <0.03   BNP Invalid input(s): POCBNP D-Dimer No results for input(s): DDIMER in the last 72 hours. Hemoglobin A1C No results for input(s): HGBA1C in the last 72 hours. Fasting Lipid Panel  Recent Labs  05/12/16 0718  CHOL 142  HDL 39*  LDLCALC 88  TRIG 74  CHOLHDL 3.6   Thyroid Function Tests No results for input(s): TSH, T4TOTAL, T3FREE, THYROIDAB in the last 72 hours.  Invalid input(s): FREET3  TELE  NSR  Radiology/Studies  Nm Myocar Multi W/spect W/wall Motion / Ef  Result Date: 05/12/2016 CLINICAL DATA:  75 year old male with a history of syncope/ presyncope. Cardiovascular risk factors include smoking, diabetes, hyperlipidemia. EXAM:  MYOCARDIAL IMAGING WITH SPECT (REST AND PHARMACOLOGIC-STRESS) GATED LEFT VENTRICULAR WALL MOTION STUDY LEFT VENTRICULAR EJECTION FRACTION TECHNIQUE: Standard myocardial SPECT imaging was performed after resting intravenous injection of 10 mCi Tc-22m tetrofosmin. Subsequently, intravenous infusion of Lexiscan was performed under the supervision of the Cardiology staff. At peak effect of the drug, 30 mCi Tc-5m tetrofosmin was injected intravenously and standard myocardial SPECT imaging was performed. Quantitative gated imaging was also performed to evaluate left ventricular wall motion, and estimate left ventricular ejection fraction. COMPARISON:  None. FINDINGS: Perfusion: Moderate sized reversible perfusion defect of mild severity at the inferior lateral wall towards the apex. No fixed defect identified. Wall Motion: Normal left ventricular wall motion. No left ventricular dilation. Left Ventricular Ejection Fraction: 64 % End  diastolic volume 64 ml End systolic volume 23 ml IMPRESSION: 1. There is a moderate sized reversible perfusion defect of mild survey air D at the inferior lateral wall towards the apex. No fixed defect identified. 2. Normal left ventricular wall motion. 3. Left ventricular ejection fraction 64% 4. Non invasive risk stratification*: Intermediate *2012 Appropriate Use Criteria for Coronary Revascularization Focused Update: J Am Coll Cardiol. 2012;59(9):857-881. http://content.dementiazones.com.aspx?articleid=[email or web address] Electronically Signed   By: Gilmer Mor D.O.   On: 05/12/2016 15:06   Dg Chest Port 1 View  Result Date: 05/11/2016 CLINICAL DATA:  Palpitations, shortness of breath, near syncope EXAM: PORTABLE CHEST 1 VIEW COMPARISON:  None. FINDINGS: Lungs are clear.  No pleural effusion or pneumothorax. The heart is normal in size. IMPRESSION: No evidence of acute cardiopulmonary disease. Electronically Signed   By: Charline Bills M.D.   On: 05/11/2016 13:23    ASSESSMENT AND PLAN  1. Near syncope - his NSVT has settled down. His EF is normal. Will follow. 2. Abnormal stress test - Inferior ischemia noted in setting of normal LV function. Will plan on heart cath. Concerned about silent ischemia. 3. NSVT - asymptomatic at present and quiet. Will follow. He will need a beta blocker at discharge.   Devantae Babe,M.D.  05/13/2016 10:46 AM

## 2016-05-14 ENCOUNTER — Encounter (HOSPITAL_COMMUNITY)
Admission: EM | Disposition: A | Discharge status: Home / Self Care | Payer: Self-pay | Source: Home / Self Care | Attending: Emergency Medicine

## 2016-05-14 ENCOUNTER — Other Ambulatory Visit: Payer: Self-pay | Admitting: Cardiology

## 2016-05-14 ENCOUNTER — Encounter (HOSPITAL_COMMUNITY): Payer: Self-pay | Admitting: Cardiovascular Disease

## 2016-05-14 DIAGNOSIS — R55 Syncope and collapse: Secondary | ICD-10-CM | POA: Diagnosis not present

## 2016-05-14 DIAGNOSIS — I25810 Atherosclerosis of coronary artery bypass graft(s) without angina pectoris: Secondary | ICD-10-CM

## 2016-05-14 DIAGNOSIS — I4729 Other ventricular tachycardia: Secondary | ICD-10-CM

## 2016-05-14 DIAGNOSIS — I2510 Atherosclerotic heart disease of native coronary artery without angina pectoris: Secondary | ICD-10-CM | POA: Diagnosis not present

## 2016-05-14 DIAGNOSIS — E785 Hyperlipidemia, unspecified: Secondary | ICD-10-CM

## 2016-05-14 DIAGNOSIS — R9430 Abnormal result of cardiovascular function study, unspecified: Secondary | ICD-10-CM

## 2016-05-14 DIAGNOSIS — I10 Essential (primary) hypertension: Secondary | ICD-10-CM | POA: Diagnosis not present

## 2016-05-14 DIAGNOSIS — I472 Ventricular tachycardia: Secondary | ICD-10-CM

## 2016-05-14 HISTORY — PX: CARDIAC CATHETERIZATION: SHX172

## 2016-05-14 LAB — CBC
HCT: 37 % — ABNORMAL LOW (ref 39.0–52.0)
Hemoglobin: 11.9 g/dL — ABNORMAL LOW (ref 13.0–17.0)
MCH: 29.4 pg (ref 26.0–34.0)
MCHC: 32.2 g/dL (ref 30.0–36.0)
MCV: 91.4 fL (ref 78.0–100.0)
PLATELETS: 254 10*3/uL (ref 150–400)
RBC: 4.05 MIL/uL — AB (ref 4.22–5.81)
RDW: 13.6 % (ref 11.5–15.5)
WBC: 5.1 10*3/uL (ref 4.0–10.5)

## 2016-05-14 LAB — BASIC METABOLIC PANEL
Anion gap: 4 — ABNORMAL LOW (ref 5–15)
BUN: 14 mg/dL (ref 6–20)
CHLORIDE: 112 mmol/L — AB (ref 101–111)
CO2: 27 mmol/L (ref 22–32)
CREATININE: 1.54 mg/dL — AB (ref 0.61–1.24)
Calcium: 8.7 mg/dL — ABNORMAL LOW (ref 8.9–10.3)
GFR calc non Af Amer: 42 mL/min — ABNORMAL LOW (ref >60)
GFR, EST AFRICAN AMERICAN: 48 mL/min — AB (ref >60)
Glucose, Bld: 91 mg/dL (ref 65–99)
Potassium: 4.4 mmol/L (ref 3.5–5.1)
Sodium: 143 mmol/L (ref 135–145)

## 2016-05-14 LAB — GLUCOSE, CAPILLARY
GLUCOSE-CAPILLARY: 81 mg/dL (ref 65–99)
GLUCOSE-CAPILLARY: 77 mg/dL (ref 65–99)
Glucose-Capillary: 110 mg/dL — ABNORMAL HIGH (ref 65–99)
Glucose-Capillary: 85 mg/dL (ref 65–99)

## 2016-05-14 LAB — CREATININE, SERUM
CREATININE: 1.45 mg/dL — AB (ref 0.61–1.24)
GFR, EST AFRICAN AMERICAN: 52 mL/min — AB (ref >60)
GFR, EST NON AFRICAN AMERICAN: 45 mL/min — AB (ref >60)

## 2016-05-14 SURGERY — LEFT HEART CATH AND CORONARY ANGIOGRAPHY
Anesthesia: LOCAL

## 2016-05-14 MED ORDER — DIAZEPAM 5 MG PO TABS: 5.0000 mg | ORAL_TABLET | ORAL | Status: DC | PRN

## 2016-05-14 MED ORDER — FENTANYL CITRATE (PF) 100 MCG/2ML IJ SOLN
INTRAMUSCULAR | Status: DC | PRN
  Administered 2016-05-14: 25 ug via INTRAVENOUS

## 2016-05-14 MED ORDER — ACETAMINOPHEN 325 MG PO TABS: 650.0000 mg | ORAL_TABLET | ORAL | Status: DC | PRN

## 2016-05-14 MED ORDER — SODIUM CHLORIDE 0.9 % IV SOLN: 150.0000 mL/h | INTRAVENOUS | Status: AC

## 2016-05-14 MED ORDER — VERAPAMIL HCL 2.5 MG/ML IV SOLN
INTRAVENOUS | Status: AC
  Filled 2016-05-14: qty 2

## 2016-05-14 MED ORDER — MIDAZOLAM HCL 2 MG/2ML IJ SOLN
INTRAMUSCULAR | Status: AC
  Filled 2016-05-14: qty 2

## 2016-05-14 MED ORDER — LIDOCAINE HCL (PF) 1 % IJ SOLN
INTRAMUSCULAR | Status: DC | PRN
  Administered 2016-05-14: 2 mL via INTRADERMAL

## 2016-05-14 MED ORDER — METOPROLOL TARTRATE 25 MG PO TABS
25.0000 mg | ORAL_TABLET | Freq: Two times a day (BID) | ORAL | Status: DC
  Administered 2016-05-14: 25 mg via ORAL
  Filled 2016-05-14: qty 1

## 2016-05-14 MED ORDER — HEPARIN (PORCINE) IN NACL 2-0.9 UNIT/ML-% IJ SOLN
INTRAMUSCULAR | Status: AC
  Filled 2016-05-14: qty 1500

## 2016-05-14 MED ORDER — ASPIRIN 81 MG PO CHEW: 81.0000 mg | CHEWABLE_TABLET | Freq: Every day | ORAL | Status: DC

## 2016-05-14 MED ORDER — LIDOCAINE HCL (PF) 1 % IJ SOLN
INTRAMUSCULAR | Status: AC
  Filled 2016-05-14: qty 30

## 2016-05-14 MED ORDER — ATORVASTATIN CALCIUM 80 MG PO TABS: 80.0000 mg | ORAL_TABLET | Freq: Every day | ORAL | Status: DC

## 2016-05-14 MED ORDER — FENTANYL CITRATE (PF) 100 MCG/2ML IJ SOLN
INTRAMUSCULAR | Status: AC
  Filled 2016-05-14: qty 2

## 2016-05-14 MED ORDER — SODIUM CHLORIDE 0.9% FLUSH: 3.0000 mL | INTRAVENOUS | Status: DC | PRN

## 2016-05-14 MED ORDER — MIDAZOLAM HCL 2 MG/2ML IJ SOLN
INTRAMUSCULAR | Status: DC | PRN
  Administered 2016-05-14: 1 mg via INTRAVENOUS

## 2016-05-14 MED ORDER — IOPAMIDOL (ISOVUE-370) INJECTION 76%
INTRAVENOUS | Status: AC
  Filled 2016-05-14: qty 100

## 2016-05-14 MED ORDER — HEPARIN (PORCINE) IN NACL 2-0.9 UNIT/ML-% IJ SOLN
INTRAMUSCULAR | Status: DC | PRN
  Administered 2016-05-14: 1500 mL

## 2016-05-14 MED ORDER — IOPAMIDOL (ISOVUE-370) INJECTION 76%
INTRAVENOUS | Status: DC | PRN
  Administered 2016-05-14: 45 mL via INTRA_ARTERIAL

## 2016-05-14 MED ORDER — ENOXAPARIN SODIUM 40 MG/0.4ML ~~LOC~~ SOLN: 40.0000 mg | SUBCUTANEOUS | Status: DC

## 2016-05-14 MED ORDER — SODIUM CHLORIDE 0.9 % IV SOLN: INTRAVENOUS | Status: DC

## 2016-05-14 MED ORDER — METOPROLOL TARTRATE 25 MG PO TABS: 25.0000 mg | ORAL_TABLET | Freq: Two times a day (BID) | ORAL | 12 refills | Status: DC

## 2016-05-14 MED ORDER — HEPARIN SODIUM (PORCINE) 1000 UNIT/ML IJ SOLN
INTRAMUSCULAR | Status: AC
  Filled 2016-05-14: qty 1

## 2016-05-14 MED ORDER — SODIUM CHLORIDE 0.9% FLUSH: 3.0000 mL | Freq: Two times a day (BID) | INTRAVENOUS | Status: DC

## 2016-05-14 MED ORDER — HEPARIN SODIUM (PORCINE) 1000 UNIT/ML IJ SOLN
INTRAMUSCULAR | Status: DC | PRN
  Administered 2016-05-14: 4000 [IU] via INTRAVENOUS

## 2016-05-14 MED ORDER — ATORVASTATIN CALCIUM 80 MG PO TABS
80.0000 mg | ORAL_TABLET | Freq: Every day | ORAL | 12 refills | Status: DC
Start: 2016-05-14 — End: 2016-05-22

## 2016-05-14 MED ORDER — SODIUM CHLORIDE 0.9 % IV SOLN: 250.0000 mL | INTRAVENOUS | Status: DC | PRN

## 2016-05-14 MED ORDER — ONDANSETRON HCL 4 MG/2ML IJ SOLN: 4.0000 mg | Freq: Four times a day (QID) | INTRAMUSCULAR | Status: DC | PRN

## 2016-05-14 SURGICAL SUPPLY — 11 items
CATH INFINITI 5FR ANG PIGTAIL (CATHETERS) ×2 IMPLANT
CATH OPTITORQUE TIG 4.0 5F (CATHETERS) ×2 IMPLANT
DEVICE RAD COMP TR BAND LRG (VASCULAR PRODUCTS) ×2 IMPLANT
GLIDESHEATH SLEND SS 6F .021 (SHEATH) ×2 IMPLANT
KIT HEART LEFT (KITS) ×2 IMPLANT
PACK CARDIAC CATHETERIZATION (CUSTOM PROCEDURE TRAY) ×2 IMPLANT
TRANSDUCER W/STOPCOCK (MISCELLANEOUS) ×2 IMPLANT
TUBING CIL FLEX 10 FLL-RA (TUBING) ×2 IMPLANT
WIRE EMERALD 3MM-J .035X260CM (WIRE) ×2 IMPLANT
WIRE EMERALD ST .035X260CM (WIRE) IMPLANT
WIRE HI TORQ VERSACORE-J 145CM (WIRE) ×2 IMPLANT

## 2016-05-14 NOTE — Progress Notes (Signed)
Tia MaskerAlfred Brandenburg to be D/C'd Home per MD order. Discussed with the patient and all questions fully answered.    Medication List    STOP taking these medications   rosuvastatin 10 MG tablet Commonly known as:  CRESTOR     TAKE these medications   aspirin EC 81 MG tablet Take 81 mg by mouth daily.   atorvastatin 80 MG tablet Commonly known as:  LIPITOR Take 1 tablet (80 mg total) by mouth daily at 6 PM.   B-12 2500 MCG Tabs Take 2,500 mcg by mouth daily.   candesartan 16 MG tablet Commonly known as:  ATACAND Take 16 mg by mouth 2 (two) times daily.   furosemide 40 MG tablet Commonly known as:  LASIX Take 40 mg by mouth daily.   metoprolol tartrate 25 MG tablet Commonly known as:  LOPRESSOR Take 1 tablet (25 mg total) by mouth 2 (two) times daily.   sitaGLIPtin 100 MG tablet Commonly known as:  JANUVIA Take 100 mg by mouth daily.       VVS, Skin clean, dry and intact without evidence of skin break down, no evidence of skin tears noted.  IV catheter discontinued intact. Site without signs and symptoms of complications. Dressing and pressure applied.  An After Visit Summary was printed and given to the patient.  Patient escorted via WC, and D/C home via private auto.  Kai LevinsJacobs, Lawrence Mitch N  05/14/2016 7:10 PM

## 2016-05-14 NOTE — H&P (View-Only) (Signed)
Patient ID: Javarri Knope, male   DOB: 10/20/1940, 75 y.o.   MRN: [id number]    Patient Name: Daniel Ramsey Date of Encounter: 05/13/2016     Principal Problem:   Pre-syncope Active Problems:   Essential hypertension   Controlled type 2 diabetes with renal manifestation (HCC)   Dyslipidemia   Abnormal result of cardiovascular function study   Chronic renal disease, stage III    SUBJECTIVE  No chest pain or sob. Note results of stess test.  CURRENT MEDS . aspirin EC  81 mg Oral Daily  . heparin  5,000 Units Subcutaneous Q8H  . insulin aspart  0-15 Units Subcutaneous TID WC  . rosuvastatin  10 mg Oral QPM    OBJECTIVE  Vitals:   05/12/16 1108 05/12/16 1434 05/12/16 2055 05/13/16 0516  BP: (!) 151/73 128/78 119/72 121/66  Pulse:  83 (!) 58 61  Resp:  18 18 18  Temp:  98.6 F (37 C) 98 F (36.7 C) 98.2 F (36.8 C)  TempSrc:  Oral Oral Oral  SpO2:  100% 99% 100%  Weight:      Height:        Intake/Output Summary (Last 24 hours) at 05/13/16 1046 Last data filed at 05/13/16 0600  Gross per 24 hour  Intake              600 ml  Output                0 ml  Net              600 ml   Filed Weights   05/12/16 0600  Weight: 179 lb 3.7 oz (81.3 kg)    PHYSICAL EXAM  General: Pleasant, NAD. Neuro: Alert and oriented X 3. Moves all extremities spontaneously. Psych: Normal affect. HEENT:  Normal  Neck: Supple without bruits or JVD. Lungs:  Resp regular and unlabored, CTA. Heart: RRR no s3, s4, or murmurs. Abdomen: Soft, non-tender, non-distended, BS + x 4.  Extremities: No clubbing, cyanosis or edema. DP/PT/Radials 2+ and equal bilaterally.  Accessory Clinical Findings  CBC  Recent Labs  05/11/16 1346 05/12/16 0718  WBC 5.6 4.5  NEUTROABS 3.5  --   HGB 12.6* 11.5*  HCT 37.4* 36.1*  MCV 88.2 89.8  PLT 223 209   Basic Metabolic Panel  Recent Labs  05/12/16 0718 05/13/16 0519  NA 142 141  K 3.9 4.0  CL 108 111  CO2 29 26  GLUCOSE 84 93  BUN 20 18   CREATININE 1.53* 1.58*  CALCIUM 9.0 8.9   Liver Function Tests No results for input(s): AST, ALT, ALKPHOS, BILITOT, PROT, ALBUMIN in the last 72 hours. No results for input(s): LIPASE, AMYLASE in the last 72 hours. Cardiac Enzymes  Recent Labs  05/11/16 1840 05/11/16 2329 05/12/16 0718  TROPONINI <0.03 <0.03 <0.03   BNP Invalid input(s): POCBNP D-Dimer No results for input(s): DDIMER in the last 72 hours. Hemoglobin A1C No results for input(s): HGBA1C in the last 72 hours. Fasting Lipid Panel  Recent Labs  05/12/16 0718  CHOL 142  HDL 39*  LDLCALC 88  TRIG 74  CHOLHDL 3.6   Thyroid Function Tests No results for input(s): TSH, T4TOTAL, T3FREE, THYROIDAB in the last 72 hours.  Invalid input(s): FREET3  TELE  NSR  Radiology/Studies  Nm Myocar Multi W/spect W/wall Motion / Ef  Result Date: 05/12/2016 CLINICAL DATA:  75-year-old male with a history of syncope/ presyncope. Cardiovascular risk factors include smoking, diabetes, hyperlipidemia. EXAM:   MYOCARDIAL IMAGING WITH SPECT (REST AND PHARMACOLOGIC-STRESS) GATED LEFT VENTRICULAR WALL MOTION STUDY LEFT VENTRICULAR EJECTION FRACTION TECHNIQUE: Standard myocardial SPECT imaging was performed after resting intravenous injection of 10 mCi Tc-99m tetrofosmin. Subsequently, intravenous infusion of Lexiscan was performed under the supervision of the Cardiology staff. At peak effect of the drug, 30 mCi Tc-99m tetrofosmin was injected intravenously and standard myocardial SPECT imaging was performed. Quantitative gated imaging was also performed to evaluate left ventricular wall motion, and estimate left ventricular ejection fraction. COMPARISON:  None. FINDINGS: Perfusion: Moderate sized reversible perfusion defect of mild severity at the inferior lateral wall towards the apex. No fixed defect identified. Wall Motion: Normal left ventricular wall motion. No left ventricular dilation. Left Ventricular Ejection Fraction: 64 % End  diastolic volume 64 ml End systolic volume 23 ml IMPRESSION: 1. There is a moderate sized reversible perfusion defect of mild survey air D at the inferior lateral wall towards the apex. No fixed defect identified. 2. Normal left ventricular wall motion. 3. Left ventricular ejection fraction 64% 4. Non invasive risk stratification*: Intermediate *2012 Appropriate Use Criteria for Coronary Revascularization Focused Update: J Am Coll Cardiol. 2012;59(9):857-881. http://content.onlinejacc.org/article.aspx?articleid=1201161 Electronically Signed   By: Jaime  Wagner D.O.   On: 05/12/2016 15:06   Dg Chest Port 1 View  Result Date: 05/11/2016 CLINICAL DATA:  Palpitations, shortness of breath, near syncope EXAM: PORTABLE CHEST 1 VIEW COMPARISON:  None. FINDINGS: Lungs are clear.  No pleural effusion or pneumothorax. The heart is normal in size. IMPRESSION: No evidence of acute cardiopulmonary disease. Electronically Signed   By: Sriyesh  Krishnan M.D.   On: 05/11/2016 13:23    ASSESSMENT AND PLAN  1. Near syncope - his NSVT has settled down. His EF is normal. Will follow. 2. Abnormal stress test - Inferior ischemia noted in setting of normal LV function. Will plan on heart cath. Concerned about silent ischemia. 3. NSVT - asymptomatic at present and quiet. Will follow. He will need a beta blocker at discharge.   Gregg Taylor,M.D.  05/13/2016 10:46 AM 

## 2016-05-14 NOTE — Discharge Summary (Signed)
Discharge Summary    Patient ID: Daniel Ramsey,  MRN: [id number], DOB/AGE: 77-04-1941 77 y.o.  Admit date: 05/11/2016 Discharge date: 05/14/2016  Primary Care Provider: No primary care provider on file. Primary Cardiologist: New - Dr. Eden EmmsNishan   Discharge Diagnoses    Principal Problem:   Pre-syncope Active Problems:   Essential hypertension   Controlled type 2 diabetes with renal manifestation (HCC)   Dyslipidemia   Abnormal result of cardiovascular function study   Chronic renal disease, stage III   Coronary artery disease involving coronary bypass graft of native heart without angina pectoris   Allergies No Known Allergies  Diagnostic Studies/Procedures  Transthoracic Echocardiography 05/12/16 Study Conclusions  - Left ventricle: The cavity size was normal. Wall thickness was   normal. Systolic function was normal. The estimated ejection   fraction was in the range of 55% to 60%. Wall motion was normal;   there were no regional wall motion abnormalities. Features are   consistent with a pseudonormal left ventricular filling pattern,   with concomitant abnormal relaxation and increased filling   pressure (grade 2 diastolic dysfunction). - Right atrium: The atrium was mildly dilated.  Left Heart Cath and Coronary Angiography  Ost 2nd Mrg to 2nd Mrg lesion, 90 %stenosed.  Ost LAD to Prox LAD lesion, 20 %stenosed.   Previously documented normal LV systolic function on both echo and nuclear imaging. LVEDP 19 mm Hg.  Mild CAD with a smooth 20% proximal LAD narrowing; a large left circumflex coronary artery with a 90% ostial stenosis in a very small distal marginal branch (1.5 mm); and a normal large RCA.  RECOMMENDATION: Medical therapy with initiation of beta blockers in light of the patient's documented nonsustained ventricular tachycardia.  He has normal wall motion with nuclear and echo Doppler assessment and had a small area of inferolateral ischemia, most likely  due to the distal marginal stenosis with ostial narrowing.  _____________   History of Present Illness   Daniel Ramsey is a 75 year old male with past medical history of hypertension, hyperlipidemia, vertigo and diabetes mellitus type 2. Reports he currently lives in [place], but has been here visiting his daughter for the past couple of weeks. He denies ever having had a cardiac workup in the past, or seeing a cardiologist. Does report family history of cardiac disease with mother having an MI in her 9770s. States his father passed when he was young.  He is very active 75 year old who currently cares for his wife back at home. States he does not normally have any anginal symptoms with physical activity, or dyspnea on exertion. Reports he has had intermittent episodes over the past couple of weeks where he is felt lightheaded for a brief period, but normally resolves itself. Reports this morning at 9 AM he began to have palpitations, felt like his heart was racing, and his head felt "funny". Reports he has been drinking Herbalife shakes and teas over the past couple of weeks. He is unsure whether there is caffeine and these products. Also does report being under increased amount of stress with having his granddaughter visited him in [place], and caring for his wife.  [facility] with his symptoms and experienced another near syncopal episode while there. At that time EMS was called for [facility]. In the ED his EKG shows sinus rhythm with PACs, with nonspecific T-wave changes. There are no other EKGs on file to compare too. Labs showed normal electrolytes, but increased creatinine  of 1.56, hemoglobin 12.6, and 1 negative POC troponin. Chest x-ray showed no edema or acute disease. While in the ED he has had 2 brief runs of NSVT, couplets, and frequent PVCs noted on telemetry. He is currently asymptomatic at time of exam.  Hospital Course  He was admitted for further evaluation.  He did have 2 short runs of NSVT on telemetry and frequent PVC's. He was not having any chest pain but had risk factors (HTN, HLD, and DM II), so it was felt that he would benefit from exercise Myoview to evaluate for ischemia.   His exercise Myoview showed a moderate size reversible perfusion defect at the inferior lateral wall. He was sent for left heart cath that showed mild CAD with a smooth 20% proximal LAD narrowing, and a large left circumflex coronary artery with a 90% ostial stenosis in a very small distal marginal branch (1.5 mm); and a normal large RCA.  Medical therapy was recommended with initiation of beta blockers in light of his nonsustained ventricular tachycardia.   He will need to wear an event monitor for 2 weeks to evaluate for further arrhythmias. We will arrange this prior to discharge.   He will follow up with our office in 2 weeks.  _____________  Discharge Vitals Blood pressure 135/71, pulse 70, temperature 98.2 F (36.8 C), temperature source Oral, resp. rate 20, height 5\' 6"  (1.676 m), weight 180 lb (81.6 kg), SpO2 97 %.  Filed Weights   05/12/16 0600 05/13/16 2037  Weight: 179 lb 3.7 oz (81.3 kg) 180 lb (81.6 kg)    Labs & Radiologic Studies     CBC  Recent Labs  05/12/16 0718 05/14/16 1248  WBC 4.5 5.1  HGB 11.5* 11.9*  HCT 36.1* 37.0*  MCV 89.8 91.4  PLT 209 254   Basic Metabolic Panel  Recent Labs  05/13/16 0519 05/14/16 0241 05/14/16 1248  NA 141 143  --   K 4.0 4.4  --   CL 111 112*  --   CO2 26 27  --   GLUCOSE 93 91  --   BUN 18 14  --   CREATININE 1.58* 1.54* 1.45*  CALCIUM 8.9 8.7*  --    Cardiac Enzymes  Recent Labs  05/11/16 1840 05/11/16 2329 05/12/16 0718  TROPONINI <0.03 <0.03 <0.03    Fasting Lipid Panel  Recent Labs  05/12/16 0718  CHOL 142  HDL 39*  LDLCALC 88  TRIG 74  CHOLHDL 3.6     Disposition   Pt is being discharged home today in good condition.  Follow-up Plans & Appointments      Discharge Instructions    Diet - low sodium heart healthy    Complete by:  As directed   Discharge instructions    Complete by:  As directed   Please restart Januvia on 05/16/16.   Increase activity slowly    Complete by:  As directed      Discharge Medications   Current Discharge Medication List    START taking these medications   Details  atorvastatin (LIPITOR) 80 MG tablet Take 1 tablet (80 mg total) by mouth daily at 6 PM. Qty: 30 tablet, Refills: 12    metoprolol tartrate (LOPRESSOR) 25 MG tablet Take 1 tablet (25 mg total) by mouth 2 (two) times daily. Qty: 60 tablet, Refills: 12      CONTINUE these medications which have NOT CHANGED   Details  aspirin EC 81 MG tablet Take 81 mg by mouth daily.  candesartan (ATACAND) 16 MG tablet Take 16 mg by mouth 2 (two) times daily.    Cyanocobalamin (B-12) 2500 MCG TABS Take 2,500 mcg by mouth daily.    furosemide (LASIX) 40 MG tablet Take 40 mg by mouth daily.    sitaGLIPtin (JANUVIA) 100 MG tablet Take 100 mg by mouth daily.      STOP taking these medications     rosuvastatin (CRESTOR) 10 MG tablet          Outstanding Labs/Studies  Hepatic function-changed to high dose statin this admission.   Duration of Discharge Encounter   Greater than 30 minutes including physician time.  Signed, Little Ishikawa NP 05/14/2016, 3:50 PM

## 2016-05-14 NOTE — Interval H&P Note (Signed)
Cath Lab Visit (complete for each Cath Lab visit)  Clinical Evaluation Leading to the Procedure:   ACS: No.  Non-ACS:    Anginal Classification: CCS II  Anti-ischemic medical therapy: No Therapy  Non-Invasive Test Results: Intermediate-risk stress test findings: cardiac mortality 1-3%/year  Prior CABG: No previous CABG      History and Physical Interval Note:  05/14/2016 9:15 AM  Tia MaskerAlfred Corsello  has presented today for surgery, with the diagnosis of positive stress test  The various methods of treatment have been discussed with the patient and family. After consideration of risks, benefits and other options for treatment, the patient has consented to  Procedure(s): Left Heart Cath and Coronary Angiography (N/A) as a surgical intervention .  The patient's history has been reviewed, patient examined, no change in status, stable for surgery.  I have reviewed the patient's chart and labs.  Questions were answered to the patient's satisfaction.     Nicki Guadalajarahomas Makalya Nave

## 2016-05-14 NOTE — Progress Notes (Addendum)
SUBJECTIVE:  No complaints  OBJECTIVE:   Vitals:   Vitals:   05/14/16 0950 05/14/16 0955 05/14/16 1000 05/14/16 1018  BP: 139/82 (!) 151/83 (!) 144/83 129/65  Pulse: 81 74 80 62  Resp: (!) 42 (!) 30 13 20   Temp:    98.2 F (36.8 C)  TempSrc:    Oral  SpO2: 100% 100% (!) 89% 97%  Weight:      Height:       I&O's:   Intake/Output Summary (Last 24 hours) at 05/14/16 1325 Last data filed at 05/14/16 0600  Gross per 24 hour  Intake          1191.97 ml  Output              600 ml  Net           591.97 ml   TELEMETRY: Reviewed telemetry pt in NSR:     PHYSICAL EXAM General: Well developed, well nourished, in no acute distress Head: Eyes PERRLA, No xanthomas.   Normal cephalic and atramatic  Lungs:   Clear bilaterally to auscultation and percussion. Heart:   HRRR S1 S2 Pulses are 2+ & equal. Abdomen: Bowel sounds are positive, abdomen soft and non-tender without masses Msk:  Back normal, normal gait. Normal strength and tone for age. Extremities:   No clubbing, cyanosis or edema.  DP +1 Neuro: Alert and oriented X 3. Psych:  Good affect, responds appropriately   LABS: Basic Metabolic Panel:  Recent Labs  16/06/9608/10/17 0519 05/14/16 0241  NA 141 143  K 4.0 4.4  CL 111 112*  CO2 26 27  GLUCOSE 93 91  BUN 18 14  CREATININE 1.58* 1.54*  CALCIUM 8.9 8.7*   Liver Function Tests: No results for input(s): AST, ALT, ALKPHOS, BILITOT, PROT, ALBUMIN in the last 72 hours. No results for input(s): LIPASE, AMYLASE in the last 72 hours. CBC:  Recent Labs  05/11/16 1346 05/12/16 0718 05/14/16 1248  WBC 5.6 4.5 5.1  NEUTROABS 3.5  --   --   HGB 12.6* 11.5* 11.9*  HCT 37.4* 36.1* 37.0*  MCV 88.2 89.8 91.4  PLT 223 209 254   Cardiac Enzymes:  Recent Labs  05/11/16 1840 05/11/16 2329 05/12/16 0718  TROPONINI <0.03 <0.03 <0.03   BNP: Invalid input(s): POCBNP D-Dimer: No results for input(s): DDIMER in the last 72 hours. Hemoglobin A1C: No results for  input(s): HGBA1C in the last 72 hours. Fasting Lipid Panel:  Recent Labs  05/12/16 0718  CHOL 142  HDL 39*  LDLCALC 88  TRIG 74  CHOLHDL 3.6   Thyroid Function Tests: No results for input(s): TSH, T4TOTAL, T3FREE, THYROIDAB in the last 72 hours.  Invalid input(s): FREET3 Anemia Panel: No results for input(s): VITAMINB12, FOLATE, FERRITIN, TIBC, IRON, RETICCTPCT in the last 72 hours. Coag Panel:   Lab Results  Component Value Date   INR 1.02 05/13/2016    RADIOLOGY: Nm Myocar Multi W/spect W/wall Motion / Ef  Result Date: 05/12/2016 CLINICAL DATA:  75 year old male with a history of syncope/ presyncope. Cardiovascular risk factors include smoking, diabetes, hyperlipidemia. EXAM: MYOCARDIAL IMAGING WITH SPECT (REST AND PHARMACOLOGIC-STRESS) GATED LEFT VENTRICULAR WALL MOTION STUDY LEFT VENTRICULAR EJECTION FRACTION TECHNIQUE: Standard myocardial SPECT imaging was performed after resting intravenous injection of 10 mCi Tc-6491m tetrofosmin. Subsequently, intravenous infusion of Lexiscan was performed under the supervision of the Cardiology staff. At peak effect of the drug, 30 mCi Tc-7691m tetrofosmin was injected intravenously and standard myocardial SPECT imaging was performed. Quantitative  gated imaging was also performed to evaluate left ventricular wall motion, and estimate left ventricular ejection fraction. COMPARISON:  None. FINDINGS: Perfusion: Moderate sized reversible perfusion defect of mild severity at the inferior lateral wall towards the apex. No fixed defect identified. Wall Motion: Normal left ventricular wall motion. No left ventricular dilation. Left Ventricular Ejection Fraction: 64 % End diastolic volume 64 ml End systolic volume 23 ml IMPRESSION: 1. There is a moderate sized reversible perfusion defect of mild survey air D at the inferior lateral wall towards the apex. No fixed defect identified. 2. Normal left ventricular wall motion. 3. Left ventricular ejection fraction 64%  4. Non invasive risk stratification*: Intermediate *2012 Appropriate Use Criteria for Coronary Revascularization Focused Update: J Am Coll Cardiol. 2012;59(9):857-881. http://content.dementiazones.com.aspx?articleid=[email or web address] Electronically Signed   By: Gilmer Mor D.O.   On: 05/12/2016 15:06   Dg Chest Port 1 View  Result Date: 05/11/2016 CLINICAL DATA:  Palpitations, shortness of breath, near syncope EXAM: PORTABLE CHEST 1 VIEW COMPARISON:  None. FINDINGS: Lungs are clear.  No pleural effusion or pneumothorax. The heart is normal in size. IMPRESSION: No evidence of acute cardiopulmonary disease. Electronically Signed   By: Charline Bills M.D.   On: 05/11/2016 13:23    Assessment & Plan    75 yo male with PMH of HTN, HLD Vertigo and DM who presented to the [facility] ED with c/o near syncope and palpitations.  1. Presyncope: Reports he has recently been drinking herbalife shakes. He drank 2 packets of herbalife teas prior to experiencing symptoms of presyncope and palpitations. Labs in the ED showed neg POC trop, Cr 1.56, stable Hgb. EKG shows SR with PACs with nonspecific T wave changes, with telemetry noting 2 short runs of NSVT, and freq PVCs. Chest x-ray was negative. He is currently asymptomatic. Denies any chest pain, but does have concerning risk factors including HTN, HLD and DM II.  -- 2D echo with normal LVF EF 55-60% and grade 2 DD. -- Trop neg x 3 -- Cath with 20% prox LAD and 90% ostial stenosis of a very small distal marginal branch not amenable to PCI, normal LCx and RCA.  Small tight OM corresponds with area of inferolateral ischemia on nuclear stress test.  Medical management recommended.  Will continue statin/ASA/BB.    2. HTN: BP controlled.  Continue ARB  3. HLD: Check Lipid panel. Last LDL 88 this admit. Changed to high dose Atorva and will need repeat FLp and ALT as an outpt.   4. DM II: Restart Januvia in 48 hours  5. AKI: Cr slightly elevated but stable with  no change after holding Lasix.  LVEDP was elevated at on cath so will restart Lasix and get outpt BMET in a few days.  6.  NSVT in setting of normal LVF.  Will continue BB.  Instructed to avoid herbal meds and caffeine.  Will get an event monitor to assess for any further arrhythmias.  Patient stable from a cardiac standpoint for discharge.  Will schedule early followup in our office.   Armanda Magic, MD  05/14/2016  1:25 PM

## 2016-05-15 ENCOUNTER — Ambulatory Visit (INDEPENDENT_AMBULATORY_CARE_PROVIDER_SITE_OTHER): Payer: PRIVATE HEALTH INSURANCE

## 2016-05-15 DIAGNOSIS — I4729 Other ventricular tachycardia: Secondary | ICD-10-CM

## 2016-05-15 DIAGNOSIS — I472 Ventricular tachycardia: Secondary | ICD-10-CM

## 2016-05-20 NOTE — Progress Notes (Signed)
Cardiology Office Note    Date:  05/22/2016   ID:  Daniel Ramsey, DOB 13-Feb-1941, MRN [id number]  PCP:  No primary care provider on file.  Cardiologist:  Dr. Eden EmmsNishan  CC: post hospital follow up.   History of Present Illness:  Daniel Ramsey is a 75 y.o. male with a history of hypertension, hyperlipidemia, vertigo and diabetes mellitus type 2 who presents to clinic for post hospital follow up.   He lives in [place], but has been in [place] visiting his daughter. He denies ever having had a cardiac workup in the past, or seeing a cardiologist. Does report family history of cardiac disease with mother having an MI in her 8270s. His father passed when he was young.  He was recently admitted to [facility] from 9/8-9/11/17. He presented with pre syncope. In the ER he had 2 short runs of NSVT on telemetry and frequent PVC's. He was not having any chest pain but had risk factors (HTN, HLD, and DM II), so it was felt that he would benefit from exercise Myoview to evaluate for ischemia. His exercise Myoview showed a moderate size reversible perfusion defect at the inferior lateral wall. He was sent for left heart cath that showed mild CAD with a smooth 20% proximal LAD narrowing, and a large left circumflex coronary artery with a 90% ostial stenosis in a very small distal marginal branch (1.5 mm); and a normal large RCA. Medical therapy was recommended with initiation of beta blockers in light of his nonsustained ventricular tachycardia. He was set up for a 2 week event monitor to evaluate for further arrhythmias.   Today he presents to clinic for follow up. Monitor was applied on 9-12. So far everything is normal. Lowest heart rate has been in the 60's and some PVC's. He has been feeling well with no chest pain or SOB. No LE edema, orthopnea or PND. He does get some LE edema at night. No more dizziness or syncope. He is feeling well.     Past Medical History:  Diagnosis Date  . Diabetes mellitus without  complication (HCC)   . Hyperlipemia     Past Surgical History:  Procedure Laterality Date  . CARDIAC CATHETERIZATION N/A 05/14/2016   Procedure: Left Heart Cath and Coronary Angiography;  Surgeon: Lennette Biharihomas A Kelly, MD;  [facility];  Service: Cardiovascular;  Laterality: N/A;    Current Medications: Outpatient Medications Prior to Visit  Medication Sig Dispense Refill  . aspirin EC 81 MG tablet Take 81 mg by mouth daily.    . Cyanocobalamin (B-12) 2500 MCG TABS Take 2,500 mcg by mouth daily.    . furosemide (LASIX) 40 MG tablet Take 40 mg by mouth daily.    . sitaGLIPtin (JANUVIA) 100 MG tablet Take 100 mg by mouth daily.    Marland Kitchen. atorvastatin (LIPITOR) 80 MG tablet Take 1 tablet (80 mg total) by mouth daily at 6 PM. 30 tablet 12  . candesartan (ATACAND) 16 MG tablet Take 16 mg by mouth 2 (two) times daily.    . metoprolol tartrate (LOPRESSOR) 25 MG tablet Take 1 tablet (25 mg total) by mouth 2 (two) times daily. 60 tablet 12   No facility-administered medications prior to visit.      Allergies:   Review of patient's allergies indicates no known allergies.   Social History   Social History  . Marital status: Single    Spouse name: N/A  . Number of children: N/A  . Years of education: N/A  Social History Main Topics  . Smoking status: Former Games developer  . Smokeless tobacco: Never Used  . Alcohol use No  . Drug use: No  . Sexual activity: Not Asked   Other Topics Concern  . None   Social History Narrative  . None     Family History:  The patient'sfamily history includes Heart attack in his mother; Hypertension in his son.     ROS:   Please see the history of present illness.    ROS All other systems reviewed and are negative.   PHYSICAL EXAM:   VS:  BP 110/60   Pulse 66   Ht 5\' 6"  (1.676 m)   Wt 179 lb 9.6 oz (81.5 kg)   SpO2 97%   BMI 28.99 kg/m    GEN: Well nourished, well developed, in no acute distress  HEENT: normal  Neck: no JVD, carotid  bruits, or masses Cardiac: RRR; no murmurs, rubs, or gallops,no edema  Respiratory:  clear to auscultation bilaterally, normal work of breathing GI: soft, nontender, nondistended, + BS MS: no deformity or atrophy  Skin: warm and dry, no rash Neuro:  Alert and Oriented x 3, Strength and sensation are intact Psych: euthymic mood, full affect  Wt Readings from Last 3 Encounters:  05/22/16 179 lb 9.6 oz (81.5 kg)  05/13/16 180 lb (81.6 kg)      Studies/Labs Reviewed:   EKG:  EKG is NOT ordered today.   Recent Labs: 05/14/2016: BUN 14; Creatinine, Ser 1.45; Hemoglobin 11.9; Platelets 254; Potassium 4.4; Sodium 143   Lipid Panel    Component Value Date/Time   CHOL 142 05/12/2016 0718   TRIG 74 05/12/2016 0718   HDL 39 (L) 05/12/2016 0718   CHOLHDL 3.6 05/12/2016 0718   VLDL 15 05/12/2016 0718   LDLCALC 88 05/12/2016 0718    Additional studies/ records that were reviewed today include:  Transthoracic Echocardiography 05/12/16 Study Conclusions - Left ventricle: The cavity size was normal. Wall thickness was normal. Systolic function was normal. The estimated ejection fraction was in the range of 55% to 60%. Wall motion was normal; there were no regional wall motion abnormalities. Features are consistent with a pseudonormal left ventricular filling pattern, with concomitant abnormal relaxation and increased filling pressure (grade 2 diastolic dysfunction). - Right atrium: The atrium was mildly dilated.  Left Heart Cath and Coronary Angiography  Ost 2nd Mrg to 2nd Mrg lesion, 90 %stenosed.  Ost LAD to Prox LAD lesion, 20 %stenosed.  Previously documented normal LV systolic function on both echo and nuclear imaging. LVEDP 19 mm Hg.  Mild CAD with a smooth 20% proximal LAD narrowing; a large left circumflex coronary artery with a 90% ostial stenosis in a very small distal marginal branch (1.5 mm); and a normal large RCA.  RECOMMENDATION: Medical therapy with  initiation of beta blockers in light of the patient's documented nonsustained ventricular tachycardia. He has normal wall motion with nuclear and echo Doppler assessment and had a small area of inferolateral ischemia, most likely due to the distal marginal stenosis with ostial narrowing.  _____________    ASSESSMENT & PLAN:   Presyncope:  -- 2D echo with normal LVF EF 55-60% and grade 2 diastolic dysfunction. -- Cath with 20% prox LAD and 90% ostial stenosis of a very small distal marginal branch not amenable to PCI, normal LCx and RCA. Small tight OM corresponds with area of inferolateral ischemia on nuclear stress test.  Medical management recommended.  Will continue statin/ASA/BB.  HTN:BP controlled.   ZOX:[id number] atorvastatin  DM II: continue Januvia in 48 hours  CKD:Cr was slightly elevated 1.54--> 1.45 but stable with no change after holding Lasix.  LVEDP was elevated at on cath so he was restarted Lasix. Will check BMET today  NSVT: in setting of normal left ventricular function.  Will continue metoprolol tart.  Instructed to avoid herbal meds and caffeine. Event monitor shows normal sinus rhythm with some PVCs so far. Will continue monitor to completion for final read.   Chronic diastolic CHF: continue lasix.     Medication Adjustments/Labs and Tests Ordered: Current medicines are reviewed at length with the patient today.  Concerns regarding medicines are outlined above.  Medication changes, Labs and Tests ordered today are listed in the Patient Instructions below. Patient Instructions  Medication Instructions:  Your physician recommends that you continue on your current medications as directed. Please refer to the Current Medication list given to you today.   Labwork: None ordered  Testing/Procedures: None ordered  Follow-Up: Your physician recommends that you schedule a follow-up appointment in: AS NEEDED  Any Other Special Instructions Will  Be Listed Below (If Applicable).     If you need a refill on your cardiac medications before your next appointment, please call your pharmacy.      Signed, Cline Crock, PA-C  05/22/2016 1:58 PM    [facility] [address] [place], [place], [place]  [address] Phone: [phone]; Fax: [phone]

## 2016-05-21 ENCOUNTER — Encounter: Payer: Self-pay | Admitting: Physician Assistant

## 2016-05-22 ENCOUNTER — Encounter: Payer: Self-pay | Admitting: Physician Assistant

## 2016-05-22 ENCOUNTER — Ambulatory Visit (INDEPENDENT_AMBULATORY_CARE_PROVIDER_SITE_OTHER): Payer: PRIVATE HEALTH INSURANCE | Admitting: Physician Assistant

## 2016-05-22 ENCOUNTER — Telehealth: Payer: Self-pay | Admitting: Cardiology

## 2016-05-22 VITALS — BP 110/60 | HR 66 | Ht 66.0 in | Wt 179.6 lb

## 2016-05-22 DIAGNOSIS — I4729 Other ventricular tachycardia: Secondary | ICD-10-CM

## 2016-05-22 DIAGNOSIS — I5032 Chronic diastolic (congestive) heart failure: Secondary | ICD-10-CM

## 2016-05-22 DIAGNOSIS — R55 Syncope and collapse: Secondary | ICD-10-CM | POA: Diagnosis not present

## 2016-05-22 DIAGNOSIS — E118 Type 2 diabetes mellitus with unspecified complications: Secondary | ICD-10-CM

## 2016-05-22 DIAGNOSIS — I110 Hypertensive heart disease with heart failure: Secondary | ICD-10-CM

## 2016-05-22 DIAGNOSIS — N189 Chronic kidney disease, unspecified: Secondary | ICD-10-CM

## 2016-05-22 DIAGNOSIS — I472 Ventricular tachycardia: Secondary | ICD-10-CM

## 2016-05-22 DIAGNOSIS — I2510 Atherosclerotic heart disease of native coronary artery without angina pectoris: Secondary | ICD-10-CM

## 2016-05-22 MED ORDER — CANDESARTAN CILEXETIL 16 MG PO TABS: 16.0000 mg | ORAL_TABLET | Freq: Two times a day (BID) | ORAL | 1 refills | Status: AC

## 2016-05-22 MED ORDER — ATORVASTATIN CALCIUM 80 MG PO TABS: 80.0000 mg | ORAL_TABLET | Freq: Every day | ORAL | 1 refills | Status: AC

## 2016-05-22 MED ORDER — METOPROLOL TARTRATE 25 MG PO TABS: 25.0000 mg | ORAL_TABLET | Freq: Two times a day (BID) | ORAL | 1 refills | Status: AC

## 2016-05-22 MED ORDER — ATORVASTATIN CALCIUM 80 MG PO TABS
80.0000 mg | ORAL_TABLET | Freq: Every day | ORAL | 1 refills | Status: DC
Start: 2016-05-22 — End: 2016-05-22

## 2016-05-22 MED ORDER — METOPROLOL TARTRATE 25 MG PO TABS: 25.0000 mg | ORAL_TABLET | Freq: Two times a day (BID) | ORAL | 1 refills | Status: DC

## 2016-05-22 NOTE — Telephone Encounter (Signed)
°*  STAT* If patient is at the pharmacy, call can be transferred to refill team.   1. Which medications need to be refilled? (please list name of each medication and dose if known) Candesartan 16mg    2. Which pharmacy/location (including street and city if local pharmacy) is medication to be sent to? [facility] on [address]   3. Do they need a 30 day or 90 day supply? 30

## 2016-05-22 NOTE — Patient Instructions (Signed)
Medication Instructions:  Your physician recommends that you continue on your current medications as directed. Please refer to the Current Medication list given to you today.   Labwork: None ordered  Testing/Procedures: None ordered  Follow-Up: Your physician recommends that you schedule a follow-up appointment in: AS NEEDED   Any Other Special Instructions Will Be Listed Below (If Applicable).     If you need a refill on your cardiac medications before your next appointment, please call your pharmacy.   

## 2016-05-23 LAB — BASIC METABOLIC PANEL
BUN: 26 mg/dL — ABNORMAL HIGH (ref 7–25)
CALCIUM: 8.9 mg/dL (ref 8.6–10.3)
CO2: 25 mmol/L (ref 20–31)
CREATININE: 1.78 mg/dL — AB (ref 0.70–1.18)
Chloride: 105 mmol/L (ref 98–110)
GLUCOSE: 114 mg/dL — AB (ref 65–99)
Potassium: 4.1 mmol/L (ref 3.5–5.3)
Sodium: 141 mmol/L (ref 135–146)
